# Patient Record
Sex: Male | Born: 1985 | Race: Black or African American | Hispanic: No | Marital: Single | Smoking: Current every day smoker
Health system: Southern US, Community
[De-identification: ages and names within clinical notes are randomized; demographics above are authoritative.]

---

## 2014-11-05 ENCOUNTER — Encounter (HOSPITAL_BASED_OUTPATIENT_CLINIC_OR_DEPARTMENT_OTHER): Payer: Self-pay | Admitting: Emergency Medicine

## 2014-11-05 ENCOUNTER — Emergency Department (HOSPITAL_BASED_OUTPATIENT_CLINIC_OR_DEPARTMENT_OTHER): Payer: Self-pay

## 2014-11-05 ENCOUNTER — Emergency Department (HOSPITAL_BASED_OUTPATIENT_CLINIC_OR_DEPARTMENT_OTHER)
Admission: EM | Admit: 2014-11-05 | Discharge: 2014-11-05 | Disposition: A | Discharge status: Home / Self Care | Payer: Self-pay | Attending: Emergency Medicine | Admitting: Emergency Medicine

## 2014-11-05 DIAGNOSIS — R1031 Right lower quadrant pain: Secondary | ICD-10-CM

## 2014-11-05 DIAGNOSIS — N23 Unspecified renal colic: Secondary | ICD-10-CM | POA: Insufficient documentation

## 2014-11-05 DIAGNOSIS — Z720 Tobacco use: Secondary | ICD-10-CM | POA: Insufficient documentation

## 2014-11-05 LAB — CBC WITH DIFFERENTIAL/PLATELET
BASOS ABS: 0 10*3/uL (ref 0.0–0.1)
Basophils Relative: 0 % (ref 0–1)
EOS ABS: 0 10*3/uL (ref 0.0–0.7)
EOS PCT: 0 % (ref 0–5)
HEMATOCRIT: 39.7 % (ref 39.0–52.0)
Hemoglobin: 13.1 g/dL (ref 13.0–17.0)
LYMPHS PCT: 14 % (ref 12–46)
Lymphs Abs: 1.4 10*3/uL (ref 0.7–4.0)
MCH: 27.5 pg (ref 26.0–34.0)
MCHC: 33 g/dL (ref 30.0–36.0)
MCV: 83.4 fL (ref 78.0–100.0)
MONO ABS: 0.9 10*3/uL (ref 0.1–1.0)
Monocytes Relative: 9 % (ref 3–12)
Neutro Abs: 7.8 10*3/uL — ABNORMAL HIGH (ref 1.7–7.7)
Neutrophils Relative %: 77 % (ref 43–77)
PLATELETS: 215 10*3/uL (ref 150–400)
RBC: 4.76 MIL/uL (ref 4.22–5.81)
RDW: 12.7 % (ref 11.5–15.5)
WBC: 10.1 10*3/uL (ref 4.0–10.5)

## 2014-11-05 LAB — COMPREHENSIVE METABOLIC PANEL
ALK PHOS: 87 U/L (ref 38–126)
ALT: 14 U/L — ABNORMAL LOW (ref 17–63)
ANION GAP: 15 (ref 5–15)
AST: 22 U/L (ref 15–41)
Albumin: 4.9 g/dL (ref 3.5–5.0)
BILIRUBIN TOTAL: 1.3 mg/dL — AB (ref 0.3–1.2)
BUN: 12 mg/dL (ref 6–20)
CO2: 27 mmol/L (ref 22–32)
Calcium: 9.7 mg/dL (ref 8.9–10.3)
Chloride: 97 mmol/L — ABNORMAL LOW (ref 101–111)
Creatinine, Ser: 1.36 mg/dL — ABNORMAL HIGH (ref 0.61–1.24)
GFR calc non Af Amer: >60 mL/min (ref >60)
Glucose, Bld: 126 mg/dL — ABNORMAL HIGH (ref 65–99)
Potassium: 3.8 mmol/L (ref 3.5–5.1)
Sodium: 139 mmol/L (ref 135–145)
TOTAL PROTEIN: 8.5 g/dL — AB (ref 6.5–8.1)

## 2014-11-05 LAB — URINALYSIS, ROUTINE W REFLEX MICROSCOPIC
Bilirubin Urine: NEGATIVE
Glucose, UA: NEGATIVE mg/dL
Ketones, ur: >80 mg/dL — AB
LEUKOCYTES UA: NEGATIVE
NITRITE: NEGATIVE
Protein, ur: NEGATIVE mg/dL
Specific Gravity, Urine: 1.028 (ref 1.005–1.030)
UROBILINOGEN UA: 1 mg/dL (ref 0.0–1.0)
pH: 5.5 (ref 5.0–8.0)

## 2014-11-05 LAB — URINE MICROSCOPIC-ADD ON

## 2014-11-05 MED ORDER — MORPHINE SULFATE 4 MG/ML IJ SOLN
4.0000 mg | Freq: Once | INTRAMUSCULAR | Status: AC
  Administered 2014-11-05: 4 mg via INTRAVENOUS
  Filled 2014-11-05: qty 1

## 2014-11-05 MED ORDER — IOHEXOL 300 MG/ML  SOLN
100.0000 mL | Freq: Once | INTRAMUSCULAR | Status: AC | PRN
  Administered 2014-11-05: 100 mL via INTRAVENOUS

## 2014-11-05 MED ORDER — IOHEXOL 300 MG/ML  SOLN
50.0000 mL | Freq: Once | INTRAMUSCULAR | Status: AC | PRN
  Administered 2014-11-05: 50 mL via INTRAVENOUS

## 2014-11-05 MED ORDER — KETOROLAC TROMETHAMINE 10 MG PO TABS: 10.0000 mg | ORAL_TABLET | Freq: Four times a day (QID) | ORAL | Status: AC | PRN

## 2014-11-05 MED ORDER — OXYCODONE-ACETAMINOPHEN 5-325 MG PO TABS: 1.0000 | ORAL_TABLET | ORAL | Status: AC | PRN

## 2014-11-05 MED ORDER — ONDANSETRON HCL 4 MG/2ML IJ SOLN
4.0000 mg | Freq: Once | INTRAMUSCULAR | Status: AC
  Administered 2014-11-05: 4 mg via INTRAVENOUS
  Filled 2014-11-05: qty 2

## 2014-11-05 MED ORDER — TAMSULOSIN HCL 0.4 MG PO CAPS: 0.4000 mg | ORAL_CAPSULE | Freq: Every day | ORAL | Status: AC

## 2014-11-05 MED ORDER — KETOROLAC TROMETHAMINE 30 MG/ML IJ SOLN
30.0000 mg | Freq: Once | INTRAMUSCULAR | Status: AC
  Administered 2014-11-05: 30 mg via INTRAVENOUS
  Filled 2014-11-05: qty 1

## 2014-11-05 NOTE — ED Notes (Signed)
RLQ pain and tenderness with vomiting since yesterday.  Pt sts he just got in town today - [place]

## 2014-11-05 NOTE — ED Notes (Signed)
MD at bedside. 

## 2014-11-05 NOTE — ED Provider Notes (Signed)
CSN: [id number]     Arrival date & time 11/05/14  0018 History   First MD Initiated Contact with Patient 11/05/14 0026     Chief Complaint  Patient presents with  . Abdominal Pain     (Consider location/radiation/quality/duration/timing/severity/associated sxs/prior Treatment) HPI Issue presents with right lower quadrant pain. Started gradually on Thursday and gradually progressed. Associated with anorexia, nausea, vomiting and chills. Pain is worse with movement and palpation. Admits to mild dysuria. No previous abdominal surgeries. No testicular pain or masses. No penile discharge. History reviewed. No pertinent past medical history. History reviewed. No pertinent past surgical history. No family history on file. History  Substance Use Topics  . Smoking status: Current Every Day Smoker -- 0.50 packs/day  . Smokeless tobacco: Not on file  . Alcohol Use: No    Review of Systems  Constitutional: Positive for chills. Negative for fever.  Respiratory: Negative for shortness of breath.   Cardiovascular: Negative for chest pain.  Gastrointestinal: Positive for nausea, vomiting and abdominal pain. Negative for diarrhea and constipation.  Genitourinary: Positive for dysuria. Negative for frequency, hematuria, flank pain, scrotal swelling, penile pain and testicular pain.  Musculoskeletal: Negative for back pain, neck pain and neck stiffness.  Skin: Negative for rash and wound.  Neurological: Negative for dizziness, weakness, light-headedness, numbness and headaches.  All other systems reviewed and are negative.     Allergies  Review of patient's allergies indicates no known allergies.  Home Medications   Prior to Admission medications   Medication Sig Start Date End Date Taking? Authorizing Provider  ketorolac (TORADOL) 10 MG tablet Take 1 tablet (10 mg total) by mouth every 6 (six) hours as needed. 11/05/14   Loren Raceravid Jullisa Grigoryan, MD  oxyCODONE-acetaminophen (PERCOCET) 5-325 MG per  tablet Take 1-2 tablets by mouth every 4 (four) hours as needed for severe pain. 11/05/14   Loren Raceravid Infiniti Hoefling, MD  tamsulosin (FLOMAX) 0.4 MG CAPS capsule Take 1 capsule (0.4 mg total) by mouth daily. 11/05/14   Loren Raceravid Nikitas Davtyan, MD   BP 128/62 mmHg  Pulse 70  Temp(Src) 99.2 F (37.3 C) (Oral)  Resp 16  Ht 5\' 9"  (1.753 m)  Wt 150 lb (68.04 kg)  BMI 22.14 kg/m2  SpO2 99% Physical Exam  Constitutional: He is oriented to person, place, and time. He appears well-developed and well-nourished. No distress.  HENT:  Head: Normocephalic and atraumatic.  Mouth/Throat: Oropharynx is clear and moist.  Eyes: EOM are normal. Pupils are equal, round, and reactive to light.  Neck: Normal range of motion. Neck supple.  Cardiovascular: Normal rate and regular rhythm.   Pulmonary/Chest: Effort normal and breath sounds normal. No respiratory distress. He has no wheezes. He has no rales.  Abdominal: Soft. Bowel sounds are normal. There is tenderness. There is no rebound and no guarding.  + Rovsing sign. Right lower quadrant tenderness to palpation with guarding. No rebound tenderness.  Genitourinary:  Uncircumcised penis. No penile discharge. No testicular pain or masses.  Musculoskeletal: Normal range of motion. He exhibits tenderness. He exhibits no edema.  Right CVA tenderness  Neurological: He is alert and oriented to person, place, and time.  Skin: Skin is warm and dry. No rash noted. No erythema.  Psychiatric: He has a normal mood and affect. His behavior is normal.  Nursing note and vitals reviewed.   ED Course  Procedures (including critical care time) Labs Review Labs Reviewed  URINALYSIS, ROUTINE W REFLEX MICROSCOPIC (NOT AT [facility]) - Abnormal; Notable for the following:    Hgb  urine dipstick TRACE (*)    Ketones, ur >80 (*)    All other components within normal limits  CBC WITH DIFFERENTIAL/PLATELET - Abnormal; Notable for the following:    Neutro Abs 7.8 (*)    All other components within  normal limits  COMPREHENSIVE METABOLIC PANEL - Abnormal; Notable for the following:    Chloride 97 (*)    Glucose, Bld 126 (*)    Creatinine, Ser 1.36 (*)    Total Protein 8.5 (*)    ALT 14 (*)    Total Bilirubin 1.3 (*)    All other components within normal limits  URINE MICROSCOPIC-ADD ON    Imaging Review Ct Abdomen Pelvis W Contrast  11/05/2014   CLINICAL DATA:  Acute onset of right lower quadrant abdominal pain and tenderness. Vomiting. Right flank pain. Initial encounter.  EXAM: CT ABDOMEN AND PELVIS WITH CONTRAST  TECHNIQUE: Multidetector CT imaging of the abdomen and pelvis was performed using the standard protocol following bolus administration of intravenous contrast.  CONTRAST:  OMNIPAQUE IOHEXOL 300 MG/ML  SOLN  COMPARISON:  None.  FINDINGS: The visualized lung bases are clear.  The liver and spleen are unremarkable in appearance. The gallbladder is within normal limits. The pancreas and adrenal glands are unremarkable.  Moderate chronic appearing right-sided hydronephrosis is noted, with diffuse distention of the right ureter to the level of an obstructing 4 mm stone at the right vesicoureteral junction. Soft tissue inflammation is noted about the stone. Mild soft tissue stranding is noted about the right kidney. There is slightly decreased right renal enhancement. Would correlate for evidence of pyelonephritis.  A 4 mm nonobstructing stone is noted near the upper pole of the left kidney. The left kidney is otherwise unremarkable.  No free fluid is identified. The small bowel is unremarkable in appearance. The stomach is within normal limits. No acute vascular abnormalities are seen.  The appendix is normal in caliber, without evidence of appendicitis. The colon is unremarkable in appearance.  The bladder is mildly distended and otherwise unremarkable. The prostate remains normal in size. No inguinal lymphadenopathy is seen.  No acute osseous abnormalities are identified.  IMPRESSION:  1. Moderate chronic appearing right-sided hydronephrosis, with diffuse distention of the right ureter to the level of an obstructing 4 mm stone at the right vesicoureteral junction. Soft tissue inflammation noted about the level of the stone. 2. Mild soft tissue stranding about the right kidney, with slightly decreased right renal enhancement. Would correlate for any evidence of pyelonephritis. 3. 4 mm nonobstructing stone near the upper pole of the left kidney.   Electronically Signed   By: Roanna Raider M.D.   On: 11/05/2014 02:30     EKG Interpretation None      MDM   Final diagnoses:  RLQ abdominal pain  Renal colic on right side    Concern for appendicitis. Will get CT abdomen. Pt will be kept NPO.  Chronic appearing right-sided hydronephrosis with 4 mm stone at the right UVJ.  Issues afebrile with a normal white blood cell count. Urine shows no evidence of infection. Patient's pain is improved after initial dose of pain medication. Will start on Flomax and have patient follow-up with urology. Return cautions given for fever, persistent vomiting, worsening pain or any concerns.    Loren Racer, MD 11/05/14 [phone]

## 2014-11-05 NOTE — Discharge Instructions (Signed)

## 2016-03-16 IMAGING — CT CT ABD-PELV W/ CM
2 of 5 series · 15 of 46 positions shown, 17 images · IV contrast (omnipaque)
Comparison: None.

CLINICAL DATA: Acute onset of right lower quadrant abdominal pain
and tenderness. Vomiting. Right flank pain. Initial encounter.

EXAM:
CT ABDOMEN AND PELVIS WITH CONTRAST
TECHNIQUE: Multidetector CT imaging of the abdomen and pelvis was performed
using the standard protocol following bolus administration of
intravenous contrast.
CONTRAST:  100mL OMNIPAQUE IOHEXOL 300 MG/ML  SOLN

[Series 2: abd/pelvis 5.0 b31f · axial · 0.69mm/px · z∈[+206,+601]mm · 12 of 89 slices shown, 14 images]
[im 5/89  soft-tissue]
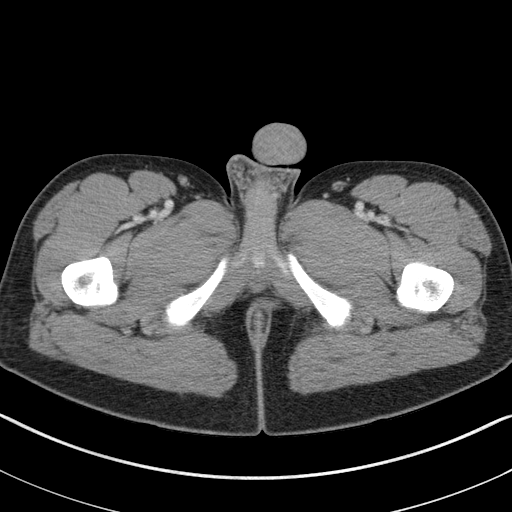
[im 5/89  bone]
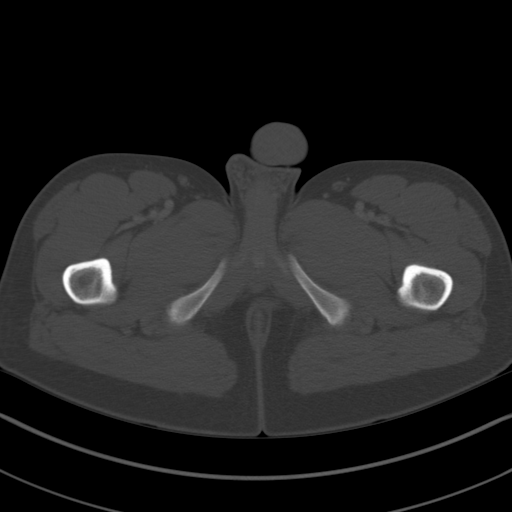
[im 14/89  soft-tissue]
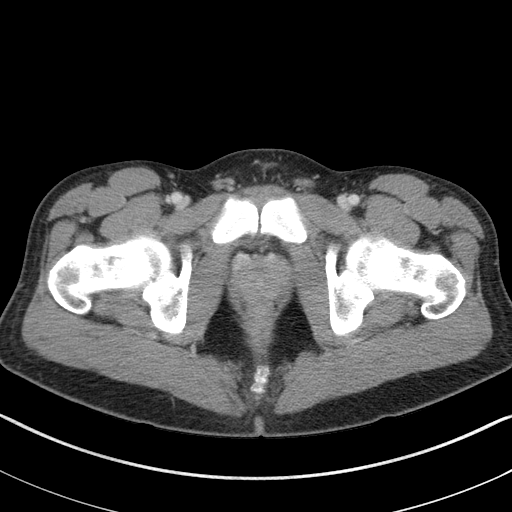
[im 18/89  soft-tissue]
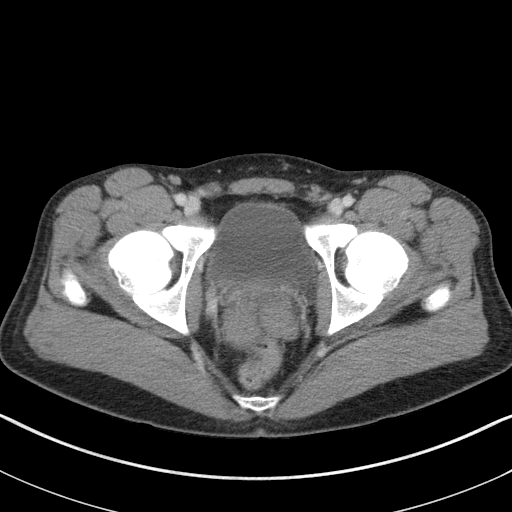
[im 27/89  soft-tissue]
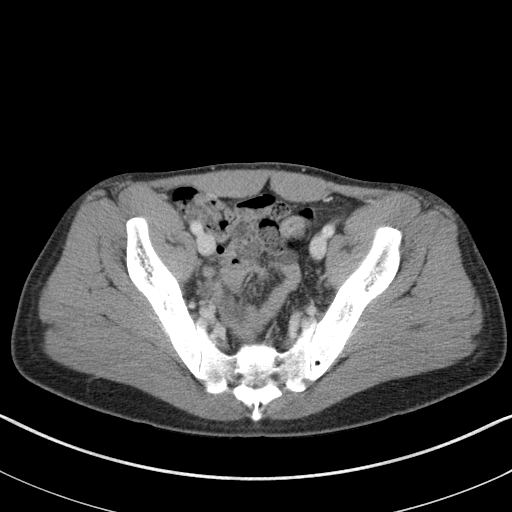
[im 36/89  soft-tissue]
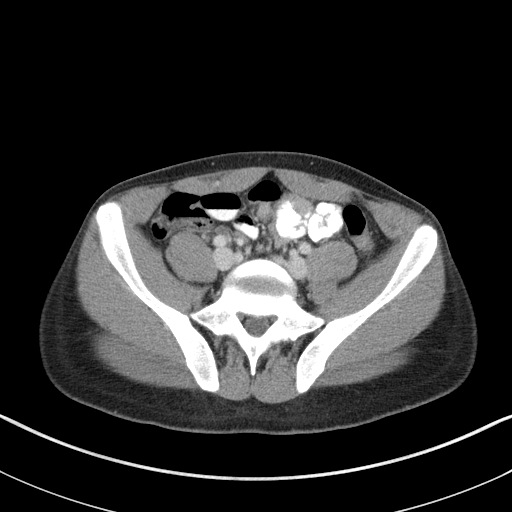
[im 40/89  soft-tissue]
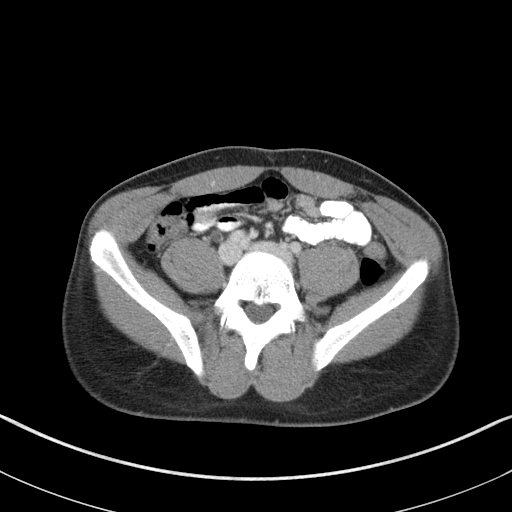
[im 49/89  soft-tissue]
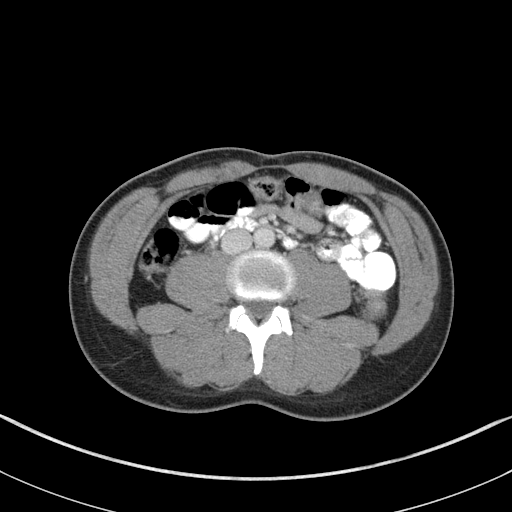
[im 53/89  soft-tissue]
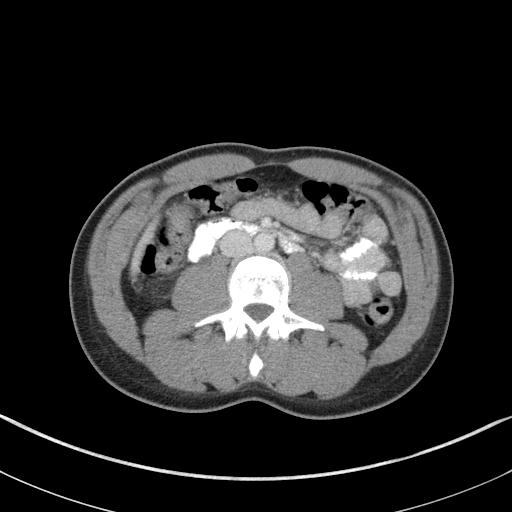
[im 62/89  soft-tissue]
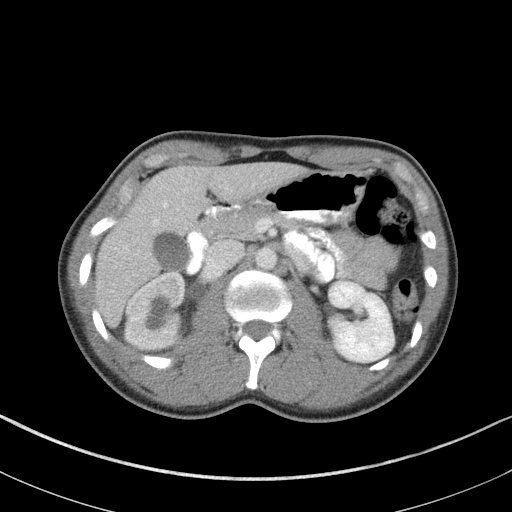
[im 62/89  bone]
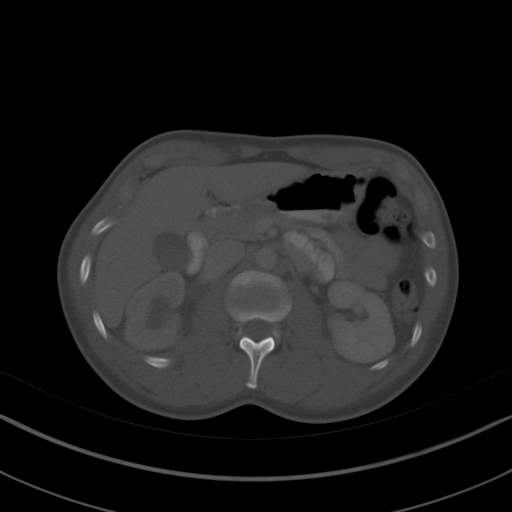
[im 71/89  soft-tissue]
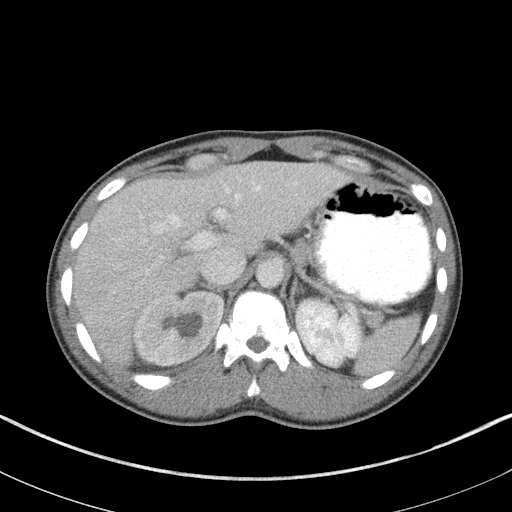
[im 75/89  soft-tissue]
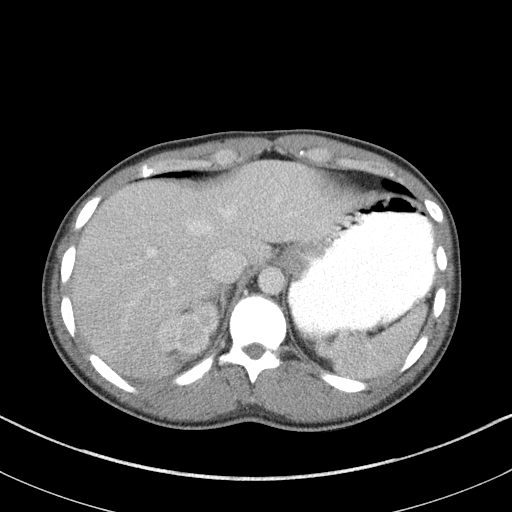
[im 84/89  soft-tissue]
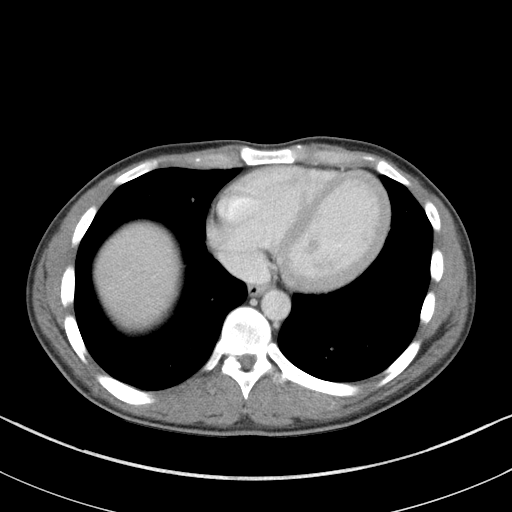

[Series 5: abd/pelvis 3.0 coronal · coronal · 0.59mm/px · 3 of 72 slices shown]
[im 24/72  soft-tissue]
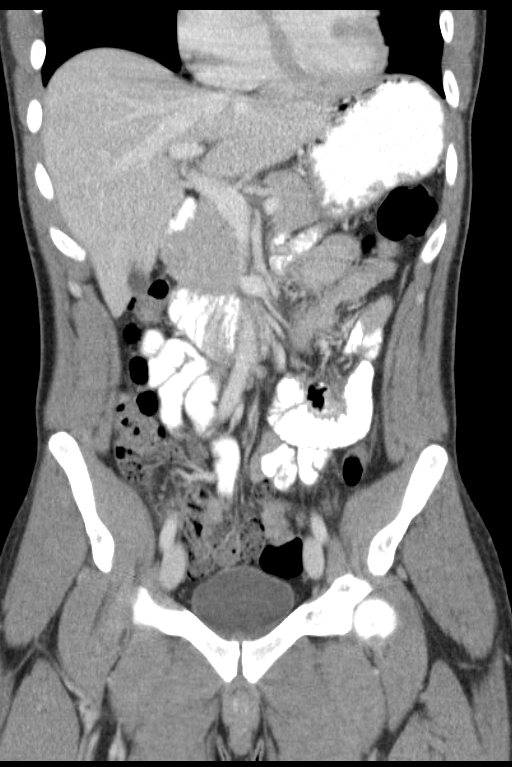
[im 32/72  soft-tissue]
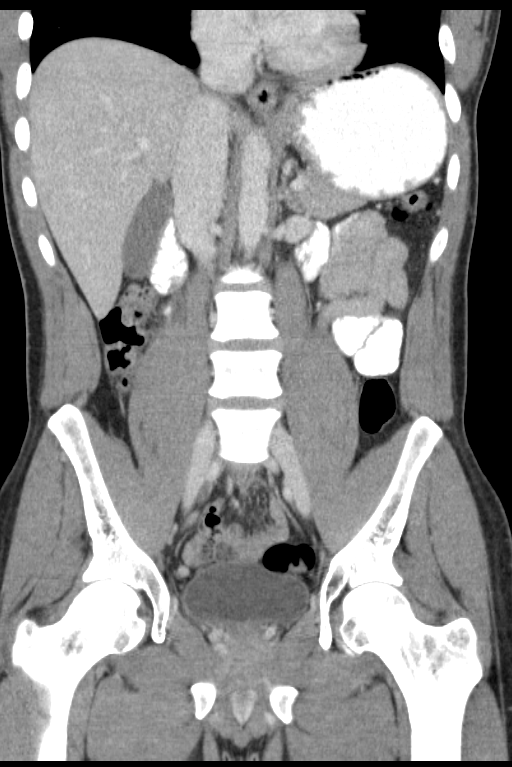
[im 40/72  soft-tissue]
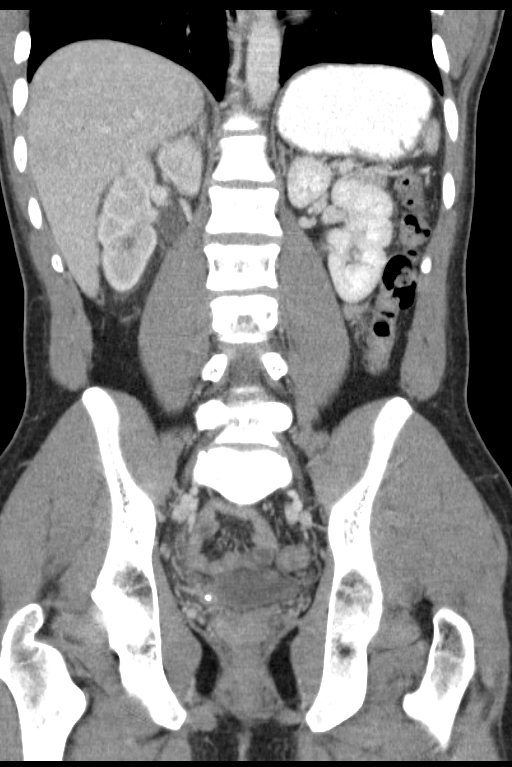

[15 of 46 positions shown; findings below may reference images not displayed]

FINDINGS: The visualized lung bases are clear.

The liver and spleen are unremarkable in appearance. The gallbladder
is within normal limits. The pancreas and adrenal glands are
unremarkable.

Moderate chronic appearing right-sided hydronephrosis is noted, with
diffuse distention of the right ureter to the level of an
obstructing 4 mm stone at the right vesicoureteral junction. Soft
tissue inflammation is noted about the stone. Mild soft tissue
stranding is noted about the right kidney. There is slightly
decreased right renal enhancement. Would correlate for evidence of
pyelonephritis.

A 4 mm nonobstructing stone is noted near the upper pole of the left
kidney. The left kidney is otherwise unremarkable.

No free fluid is identified. The small bowel is unremarkable in
appearance. The stomach is within normal limits. No acute vascular
abnormalities are seen.

The appendix is normal in caliber, without evidence of appendicitis.
The colon is unremarkable in appearance.

The bladder is mildly distended and otherwise unremarkable. The
prostate remains normal in size. No inguinal lymphadenopathy is
seen.

No acute osseous abnormalities are identified.
IMPRESSION: 1. Moderate chronic appearing right-sided hydronephrosis, with
diffuse distention of the right ureter to the level of an
obstructing 4 mm stone at the right vesicoureteral junction. Soft
tissue inflammation noted about the level of the stone.
2. Mild soft tissue stranding about the right kidney, with slightly
decreased right renal enhancement. Would correlate for any evidence
of pyelonephritis.
3. 4 mm nonobstructing stone near the upper pole of the left kidney.
